# Patient Record
Sex: Female | Born: 1950 | Race: White | Hispanic: No | Marital: Married
Health system: Southern US, Community
[De-identification: ages and names within clinical notes are randomized; demographics above are authoritative.]

## PROBLEM LIST (undated history)

## (undated) DIAGNOSIS — Z89519 Acquired absence of unspecified leg below knee: Secondary | ICD-10-CM

## (undated) DIAGNOSIS — I739 Peripheral vascular disease, unspecified: Secondary | ICD-10-CM

## (undated) DIAGNOSIS — I471 Supraventricular tachycardia: Secondary | ICD-10-CM

## (undated) DIAGNOSIS — I4719 Other supraventricular tachycardia: Secondary | ICD-10-CM

## (undated) DIAGNOSIS — I219 Acute myocardial infarction, unspecified: Secondary | ICD-10-CM

## (undated) DIAGNOSIS — I10 Essential (primary) hypertension: Secondary | ICD-10-CM

## (undated) DIAGNOSIS — Z9289 Personal history of other medical treatment: Secondary | ICD-10-CM

## (undated) HISTORY — DX: Supraventricular tachycardia: I47.1

## (undated) HISTORY — DX: Other supraventricular tachycardia: I47.19

## (undated) HISTORY — PX: CARDIAC CATHETERIZATION: SHX172

## (undated) HISTORY — DX: Acquired absence of unspecified leg below knee: Z89.519

## (undated) HISTORY — DX: Personal history of other medical treatment: Z92.89

## (undated) HISTORY — DX: Peripheral vascular disease, unspecified: I73.9

---

## 1995-12-25 HISTORY — PX: CARDIAC CATHETERIZATION: SHX172

## 1997-12-24 HISTORY — PX: CARDIAC CATHETERIZATION: SHX172

## 1998-08-26 ENCOUNTER — Ambulatory Visit (HOSPITAL_COMMUNITY): Admission: RE | Admit: 1998-08-26 | Discharge: 1998-08-26 | Payer: Self-pay | Admitting: Cardiovascular Disease

## 2010-10-24 DIAGNOSIS — Z9289 Personal history of other medical treatment: Secondary | ICD-10-CM

## 2010-10-24 HISTORY — DX: Personal history of other medical treatment: Z92.89

## 2011-10-04 ENCOUNTER — Emergency Department (HOSPITAL_COMMUNITY)
Admission: EM | Admit: 2011-10-04 | Discharge: 2011-10-04 | Disposition: A | Discharge status: Home / Self Care | Payer: Medicaid Other | Attending: Emergency Medicine | Admitting: Emergency Medicine

## 2011-10-04 ENCOUNTER — Emergency Department (HOSPITAL_COMMUNITY): Payer: Medicaid Other

## 2011-10-04 ENCOUNTER — Encounter (HOSPITAL_COMMUNITY): Payer: Self-pay | Admitting: Radiology

## 2011-10-04 DIAGNOSIS — E119 Type 2 diabetes mellitus without complications: Secondary | ICD-10-CM | POA: Insufficient documentation

## 2011-10-04 DIAGNOSIS — E7800 Pure hypercholesterolemia, unspecified: Secondary | ICD-10-CM | POA: Insufficient documentation

## 2011-10-04 DIAGNOSIS — R5381 Other malaise: Secondary | ICD-10-CM | POA: Insufficient documentation

## 2011-10-04 DIAGNOSIS — R079 Chest pain, unspecified: Secondary | ICD-10-CM | POA: Insufficient documentation

## 2011-10-04 DIAGNOSIS — R55 Syncope and collapse: Secondary | ICD-10-CM | POA: Insufficient documentation

## 2011-10-04 DIAGNOSIS — R0602 Shortness of breath: Secondary | ICD-10-CM | POA: Insufficient documentation

## 2011-10-04 DIAGNOSIS — R42 Dizziness and giddiness: Secondary | ICD-10-CM | POA: Insufficient documentation

## 2011-10-04 DIAGNOSIS — Z79899 Other long term (current) drug therapy: Secondary | ICD-10-CM | POA: Insufficient documentation

## 2011-10-04 DIAGNOSIS — R5383 Other fatigue: Secondary | ICD-10-CM | POA: Insufficient documentation

## 2011-10-04 DIAGNOSIS — I252 Old myocardial infarction: Secondary | ICD-10-CM | POA: Insufficient documentation

## 2011-10-04 DIAGNOSIS — I10 Essential (primary) hypertension: Secondary | ICD-10-CM | POA: Insufficient documentation

## 2011-10-04 HISTORY — DX: Essential (primary) hypertension: I10

## 2011-10-04 HISTORY — DX: Acute myocardial infarction, unspecified: I21.9

## 2011-10-04 LAB — POCT I-STAT, CHEM 8
Creatinine, Ser: 0.8 mg/dL (ref 0.50–1.10)
Glucose, Bld: 552 mg/dL (ref 70–99)
Hemoglobin: 11.9 g/dL — ABNORMAL LOW (ref 12.0–15.0)
Sodium: 136 mEq/L (ref 135–145)
TCO2: 18 mmol/L (ref 0–100)

## 2011-10-04 LAB — COMPREHENSIVE METABOLIC PANEL
Albumin: 3.9 g/dL (ref 3.5–5.2)
Alkaline Phosphatase: 59 U/L (ref 39–117)
BUN: 22 mg/dL (ref 6–23)
Chloride: 103 mEq/L (ref 96–112)
GFR calc Af Amer: >90 mL/min (ref >90)
Glucose, Bld: 548 mg/dL — ABNORMAL HIGH (ref 70–99)
Potassium: 5.2 mEq/L — ABNORMAL HIGH (ref 3.5–5.1)
Total Bilirubin: 0.9 mg/dL (ref 0.3–1.2)

## 2011-10-04 LAB — CBC
MCV: 88.5 fL (ref 78.0–100.0)
Platelets: 272 10*3/uL (ref 150–400)
RBC: 3.83 MIL/uL — ABNORMAL LOW (ref 3.87–5.11)
WBC: 5.7 10*3/uL (ref 4.0–10.5)

## 2011-10-04 LAB — DIFFERENTIAL
Basophils Absolute: 0 10*3/uL (ref 0.0–0.1)
Basophils Relative: 0 % (ref 0–1)
Myelocytes: 0 %
Neutro Abs: 5 10*3/uL (ref 1.7–7.7)
Neutrophils Relative %: 75 % (ref 43–77)
Promyelocytes Absolute: 0 %

## 2011-10-04 LAB — BASIC METABOLIC PANEL
CO2: 18 mEq/L — ABNORMAL LOW (ref 19–32)
Glucose, Bld: 548 mg/dL — ABNORMAL HIGH (ref 70–99)
Potassium: 5.2 mEq/L — ABNORMAL HIGH (ref 3.5–5.1)
Sodium: 136 mEq/L (ref 135–145)

## 2011-10-04 LAB — GLUCOSE, CAPILLARY: Glucose-Capillary: 299 mg/dL — ABNORMAL HIGH (ref 70–99)

## 2011-10-05 LAB — GLUCOSE, CAPILLARY: Glucose-Capillary: 513 mg/dL — ABNORMAL HIGH (ref 70–99)

## 2012-10-28 IMAGING — CT CT HEAD W/O CM
1 of 2 series · 13 of 30 positions shown, 17 images · non-contrast
Comparison: None available

CLINICAL DATA: Dizziness, near-syncope, weakness, hypertension,
diabetes, hyperglycemia

CT HEAD WITHOUT CONTRAST
TECHNIQUE: Contiguous axial images were obtained from the base of
the skull through the vertex without contrast.

[Series 2: brain · axial · 0.47mm/px · z∈[+142,+278]mm · 13 of 32 slices shown, 17 images]
[im 3/32  brain]
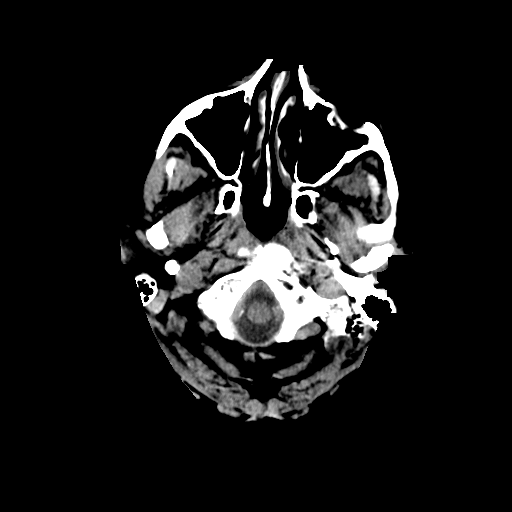
[im 3/32  bone]
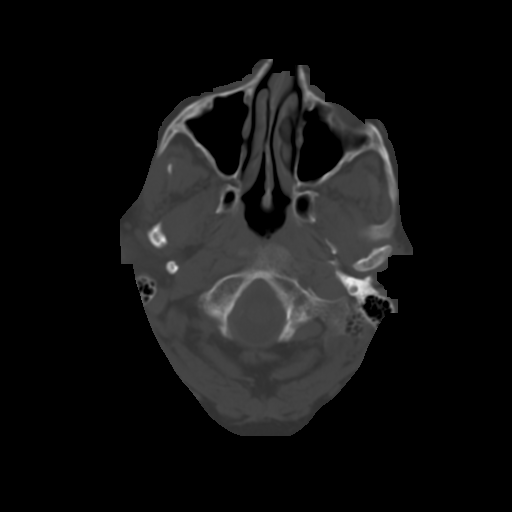
[im 5/32  brain]
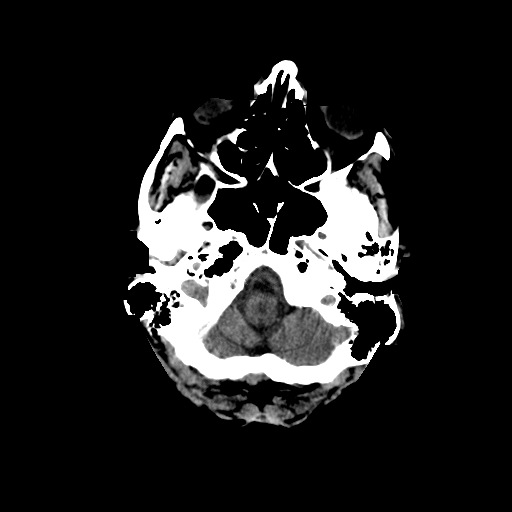
[im 7/32  brain]
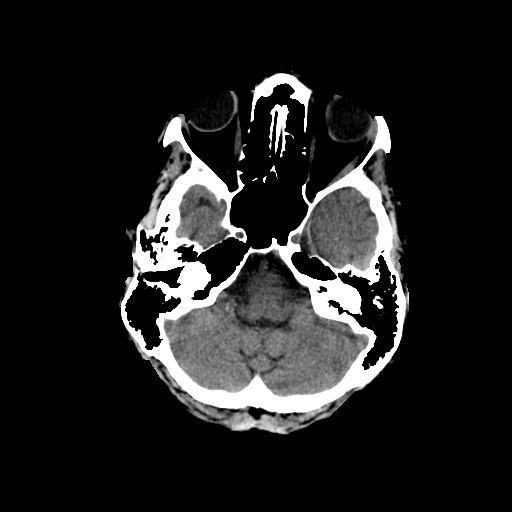
[im 9/32  brain]
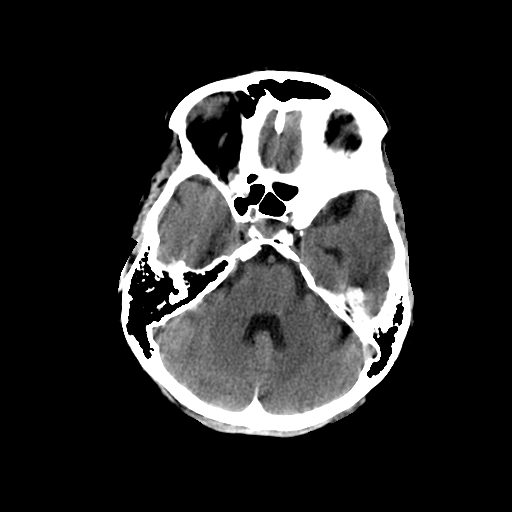
[im 12/32  brain]
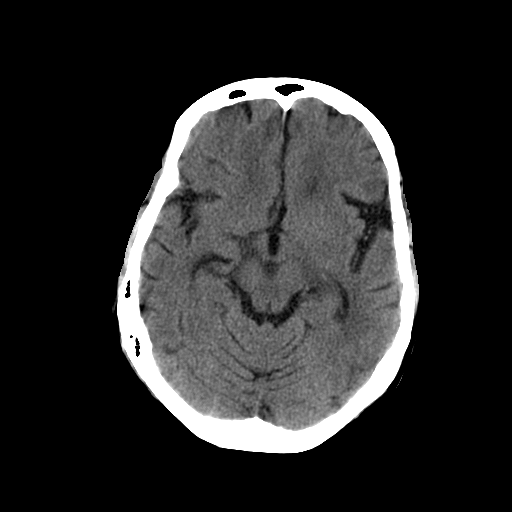
[im 12/32  bone]
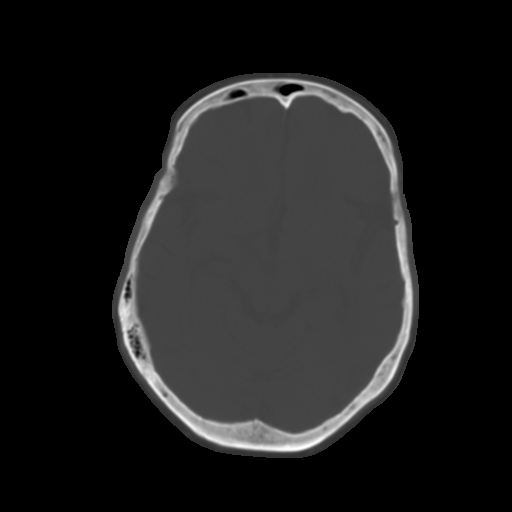
[im 14/32  brain]
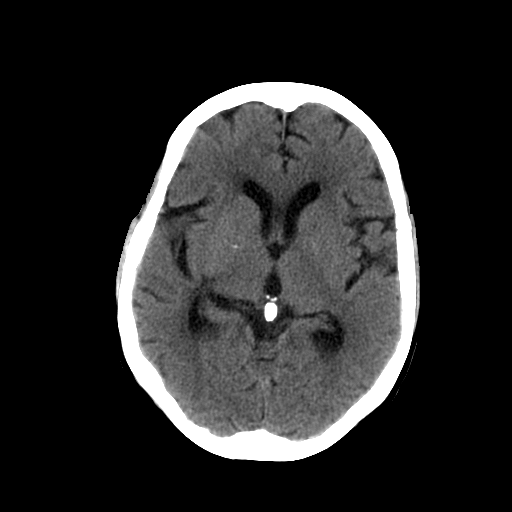
[im 16/32  brain]
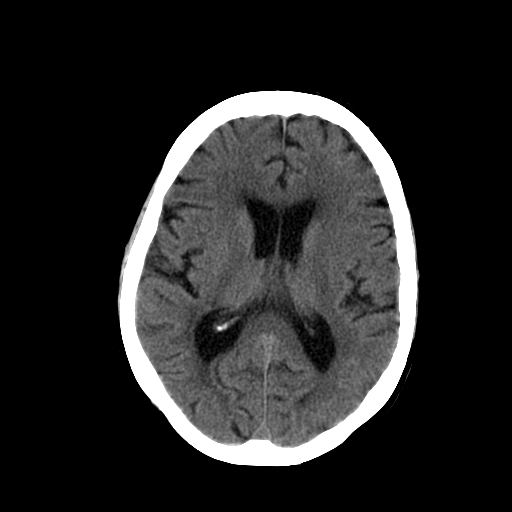
[im 18/32  brain]
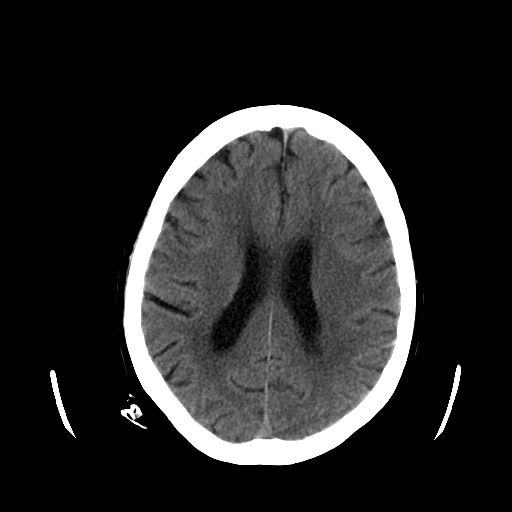
[im 20/32  brain]
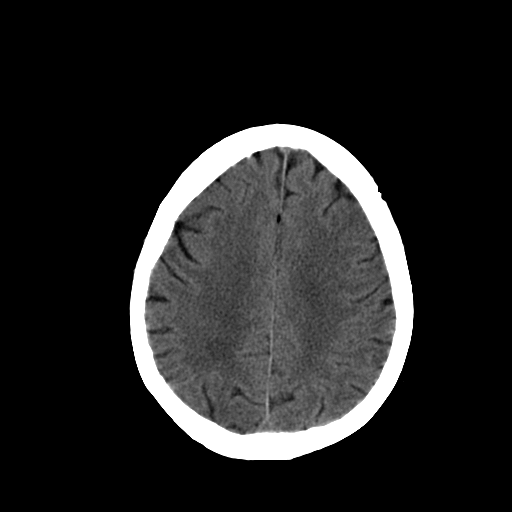
[im 20/32  bone]
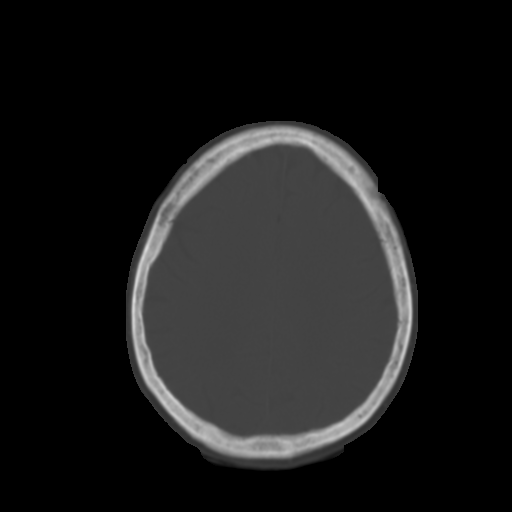
[im 23/32  brain]
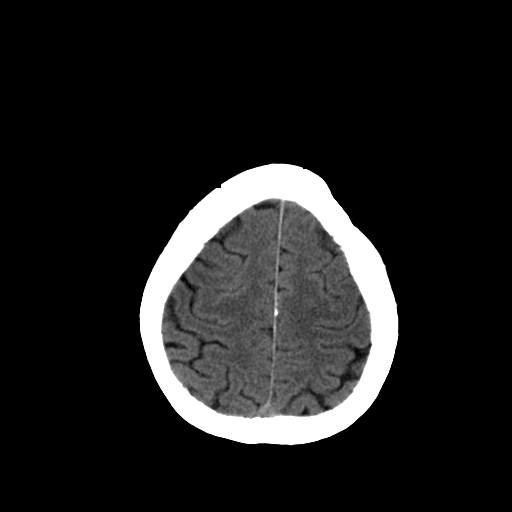
[im 25/32  brain]
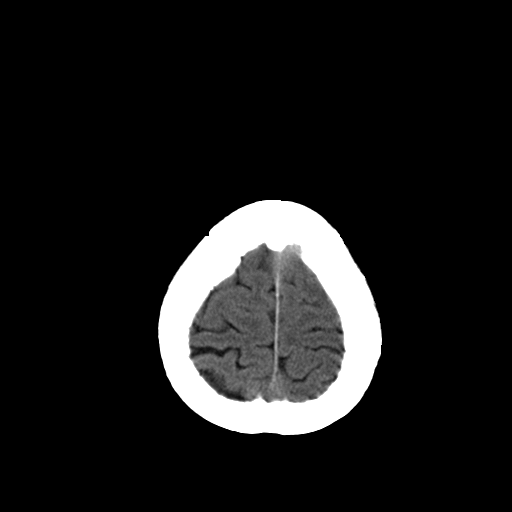
[im 27/32  brain]
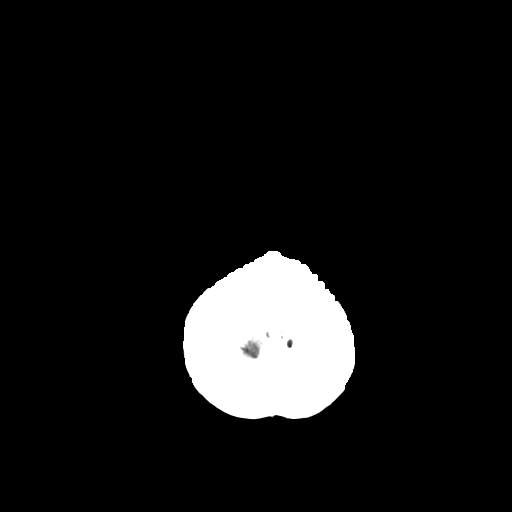
[im 29/32  brain]
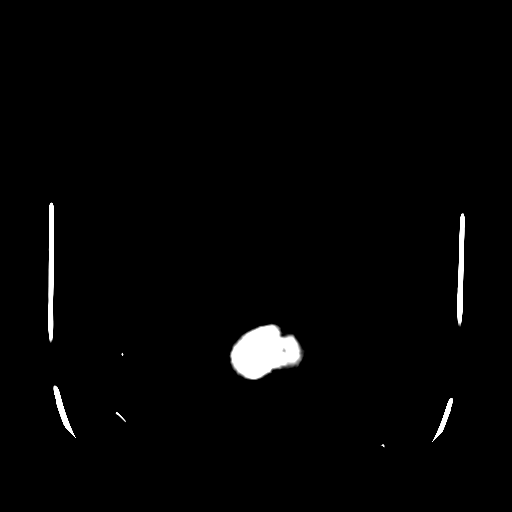
[im 29/32  bone]
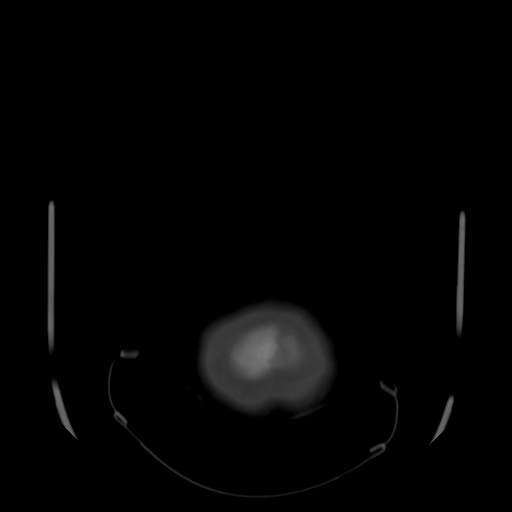

[13 of 30 positions shown; findings below may reference images not displayed]

FINDINGS: Remote left frontal craniectomy defect.  Patchy diffuse
periventricular and subcortical white matter microvascular ischemic
changes throughout the cerebrum bilaterally.  No acute intracranial
hemorrhage, definite infarction, mass lesion, midline shift,
herniation, hydrocephalus, or extra-axial fluid collection.  Gray-
white matter differentiation maintained.  Cisterns patent.  No
cerebellar abnormality.  Atherosclerosis of the intracranial
vessels.  Mastoids and sinuses clear.
IMPRESSION: No acute intracranial process by noncontrast CT.  Previous left
frontal craniectomy defect.

Microvascular chronic ischemic changes throughout the cerebral
white matter

## 2012-10-28 IMAGING — CR DG CHEST 1V PORT
1 series · 1 of 1 positions shown · non-contrast
Comparison: None.

CLINICAL DATA: Chest pain, shortness of breath, weakness, diabetes

PORTABLE CHEST - 1 VIEW

[AP]
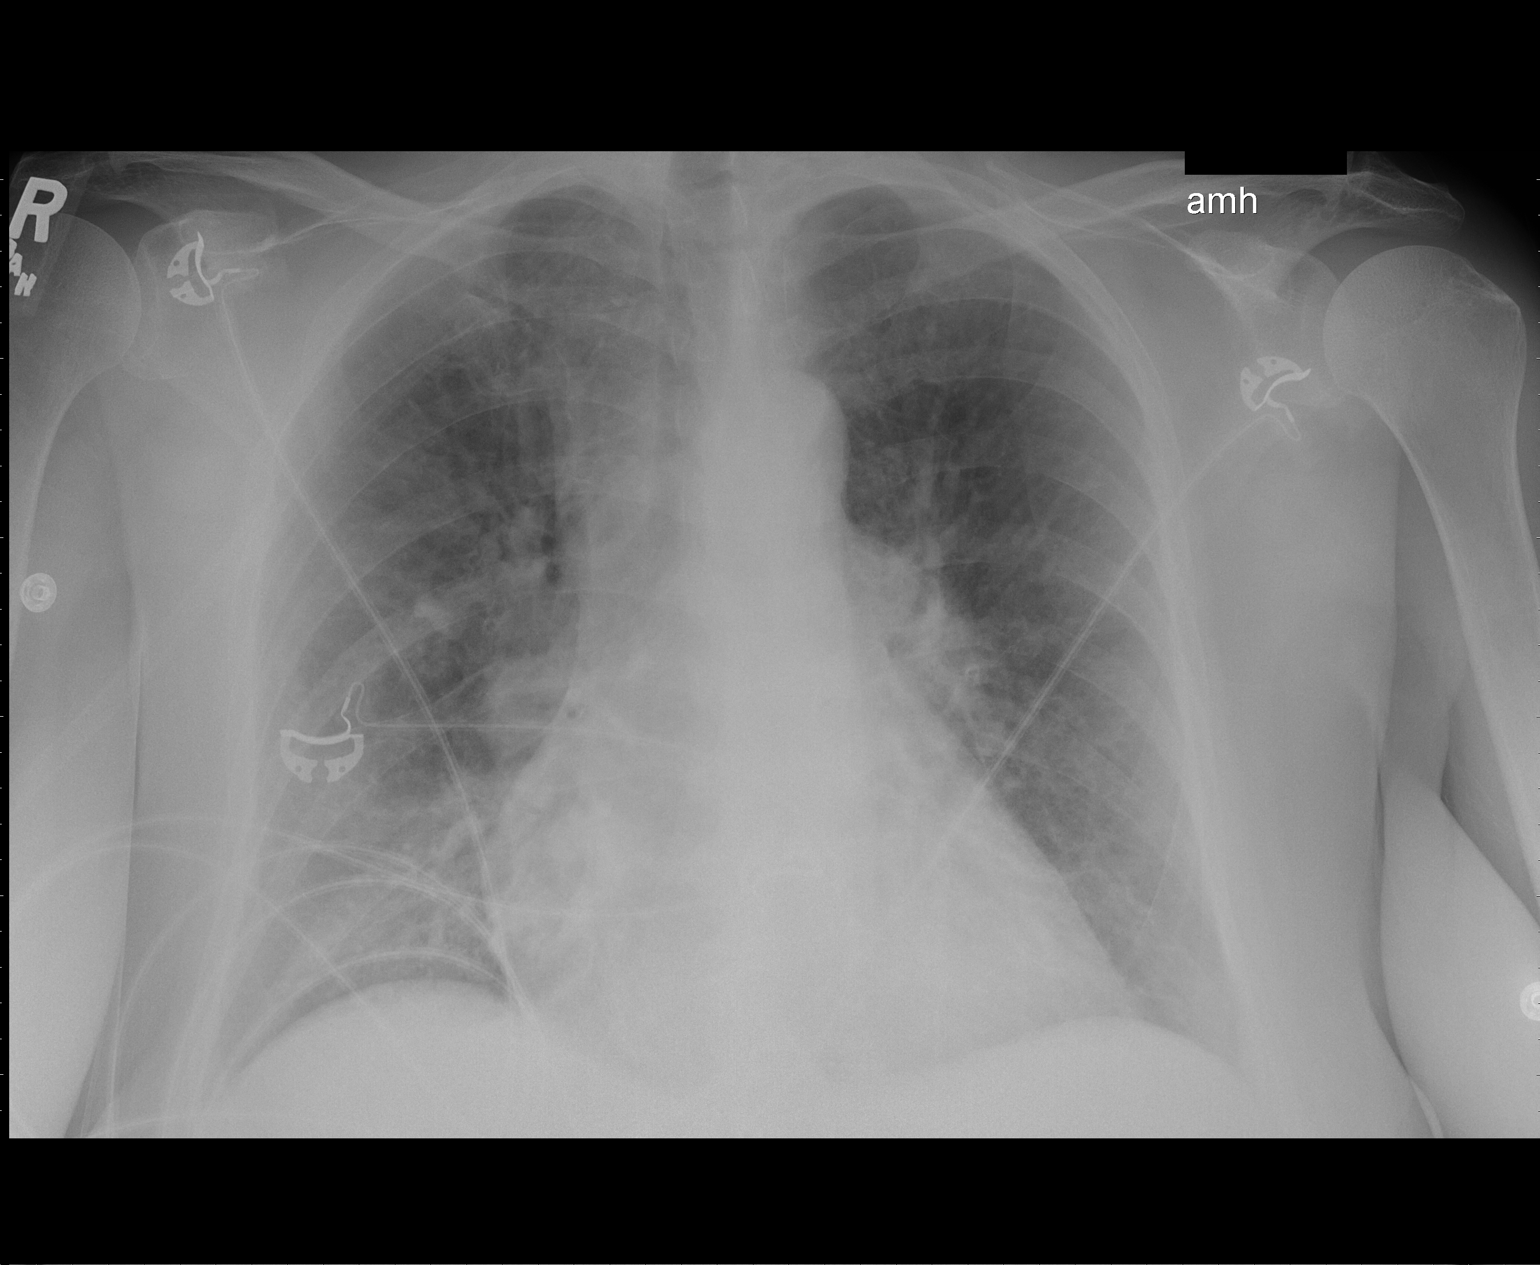

[1 of 1 positions shown; findings below may reference images not displayed]

FINDINGS: There is cardiomegaly and pulmonary vascular
cephalization without frank edema.  No focal infiltrates or
effusions are appreciated.
IMPRESSION: Cardiomegaly and pulmonary vascular cephalization without frank
edema.

## 2013-10-05 ENCOUNTER — Other Ambulatory Visit: Payer: Self-pay | Admitting: *Deleted

## 2013-10-05 MED ORDER — CHOLINE FENOFIBRATE 135 MG PO CPDR: DELAYED_RELEASE_CAPSULE | ORAL | Status: AC

## 2013-10-05 NOTE — Telephone Encounter (Signed)
Rx was sent to pharmacy electronically. 

## 2013-10-08 ENCOUNTER — Other Ambulatory Visit: Payer: Self-pay | Admitting: *Deleted

## 2013-10-08 MED ORDER — AMLODIPINE BESYLATE 5 MG PO TABS: 5.0000 mg | ORAL_TABLET | Freq: Every day | ORAL | Status: AC

## 2013-12-29 ENCOUNTER — Other Ambulatory Visit: Payer: Self-pay | Admitting: *Deleted

## 2013-12-29 MED ORDER — METOPROLOL TARTRATE 100 MG PO TABS: 100.0000 mg | ORAL_TABLET | Freq: Two times a day (BID) | ORAL | Status: AC

## 2013-12-29 NOTE — Telephone Encounter (Signed)
Called patient to clarify dosage and freq. Informed patient she needed appmt. Message sent to scheduler to contact patient. Rx was sent to pharmacy electronically.

## 2014-01-15 ENCOUNTER — Encounter: Payer: Self-pay | Admitting: *Deleted

## 2014-01-19 ENCOUNTER — Encounter: Payer: Self-pay | Admitting: Cardiovascular Disease

## 2014-01-20 ENCOUNTER — Ambulatory Visit: Payer: Medicaid Other | Admitting: Cardiovascular Disease

## 2020-03-24 DEATH — deceased
